# Patient Record
Sex: Male | Born: 1960 | Race: White | Hispanic: No | Marital: Married | Smoking: Never smoker
Health system: Southern US, Community
[De-identification: ages and names within clinical notes are randomized; demographics above are authoritative.]

## PROBLEM LIST (undated history)

## (undated) DIAGNOSIS — L309 Dermatitis, unspecified: Secondary | ICD-10-CM

## (undated) DIAGNOSIS — E119 Type 2 diabetes mellitus without complications: Secondary | ICD-10-CM

## (undated) DIAGNOSIS — Z87898 Personal history of other specified conditions: Secondary | ICD-10-CM

## (undated) DIAGNOSIS — T7840XA Allergy, unspecified, initial encounter: Secondary | ICD-10-CM

## (undated) DIAGNOSIS — K219 Gastro-esophageal reflux disease without esophagitis: Secondary | ICD-10-CM

## (undated) HISTORY — DX: Allergy, unspecified, initial encounter: T78.40XA

## (undated) HISTORY — PX: TOOTH EXTRACTION: SUR596

## (undated) HISTORY — DX: Gastro-esophageal reflux disease without esophagitis: K21.9

---

## 2008-01-01 ENCOUNTER — Emergency Department (HOSPITAL_COMMUNITY): Admission: EM | Admit: 2008-01-01 | Discharge: 2008-01-01 | Payer: Self-pay | Admitting: Emergency Medicine

## 2008-11-10 ENCOUNTER — Emergency Department (HOSPITAL_COMMUNITY): Admission: EM | Admit: 2008-11-10 | Discharge: 2008-11-10 | Payer: Self-pay | Admitting: Emergency Medicine

## 2013-07-13 ENCOUNTER — Encounter: Payer: Self-pay | Admitting: Internal Medicine

## 2013-08-31 ENCOUNTER — Ambulatory Visit (AMBULATORY_SURGERY_CENTER): Payer: BC Managed Care – PPO

## 2013-08-31 VITALS — Ht 72.0 in | Wt 209.0 lb

## 2013-08-31 DIAGNOSIS — Z1211 Encounter for screening for malignant neoplasm of colon: Secondary | ICD-10-CM

## 2013-08-31 MED ORDER — NA SULFATE-K SULFATE-MG SULF 17.5-3.13-1.6 GM/177ML PO SOLN: ORAL | Status: DC

## 2013-08-31 NOTE — Progress Notes (Signed)
Per pt, no allergies to soy or egg products.Pt not taking any weight loss meds or using  O2 at home. 

## 2013-09-10 ENCOUNTER — Encounter: Payer: Self-pay | Admitting: Internal Medicine

## 2013-09-13 ENCOUNTER — Encounter: Payer: Self-pay | Admitting: Internal Medicine

## 2013-09-13 ENCOUNTER — Ambulatory Visit (AMBULATORY_SURGERY_CENTER): Payer: BC Managed Care – PPO | Admitting: Internal Medicine

## 2013-09-13 VITALS — BP 135/87 | HR 76 | Temp 96.8°F | Resp 18 | Ht 72.0 in | Wt 209.0 lb

## 2013-09-13 DIAGNOSIS — K5730 Diverticulosis of large intestine without perforation or abscess without bleeding: Secondary | ICD-10-CM

## 2013-09-13 DIAGNOSIS — Z1211 Encounter for screening for malignant neoplasm of colon: Secondary | ICD-10-CM

## 2013-09-13 DIAGNOSIS — R0683 Snoring: Secondary | ICD-10-CM

## 2013-09-13 MED ORDER — SODIUM CHLORIDE 0.9 % IV SOLN: 500.0000 mL | INTRAVENOUS | Status: DC

## 2013-09-13 NOTE — Progress Notes (Signed)
Report to PACU, RN, vss, BBS= Clear.  

## 2013-09-13 NOTE — Op Note (Signed)
[facility] [address] [place], [id number]   COLONOSCOPY PROCEDURE REPORT  PATIENT: Jack Contreras, Jack Contreras  MR#: [id number] BIRTHDATE: [birth year] , 52  yrs. old GENDER: Male ENDOSCOPIST: Iva Booparl E Gessner, MD, [facility] REFERRED XB:[id number]:Ravisankar Felipa EthAvva, M.D. PROCEDURE DATE:  09/13/2013 PROCEDURE:   Colonoscopy, screening First Screening Colonoscopy - Avg.  risk and is 50 yrs.  old or older Yes.  Prior Negative Screening - Now for repeat screening. N/A  History of Adenoma - Now for follow-up colonoscopy & has been > or = to 3 yrs.  N/A  Polyps Removed Today? No.  Recommend repeat exam, <10 yrs? No. ASA CLASS:   Class I INDICATIONS:average risk screening and first colonoscopy. MEDICATIONS: Propofol (Diprivan) 240 mg IV, MAC sedation, administered by CRNA, and These medications were titrated to patient response per physician's verbal order  DESCRIPTION OF PROCEDURE:   After the risks benefits and alternatives of the procedure were thoroughly explained, informed consent was obtained.  A digital rectal exam revealed no abnormalities of the rectum, A digital rectal exam revealed no prostatic nodules, and A digital rectal exam revealed the prostate was not enlarged.   The LB [id number]  endoscope was introduced through the anus and advanced to the cecum, which was identified by both the appendix and ileocecal valve. No adverse events experienced.   The quality of the prep was excellent using Suprep  The instrument was then slowly withdrawn as the colon was fully examined.   COLON FINDINGS: Mild diverticulosis was noted in the sigmoid colon. The colon mucosa was otherwise normal.   A right colon retroflexion was performed.  Retroflexed views revealed no abnormalities. The time to cecum=2 minutes 16 seconds.  Withdrawal time=8 minutes 01 seconds.  The scope was withdrawn and the procedure completed. COMPLICATIONS: There were no complications.  ENDOSCOPIC IMPRESSION: 1.    Mild diverticulosis was noted in the sigmoid colon 2.   The colon mucosa was otherwise normal  RECOMMENDATIONS: Repeat colonoscopy 10 years -2025 Discuss sleep apnea evaluation with Dr. Felipa EthAvva - heavy snoring with sedation   eSigned:  Iva Booparl E Gessner, MD, [facility] 09/13/2013 2:00 PM   cc: Chilton Greathouseavisankar Avva, MD and The Patient

## 2013-09-13 NOTE — Patient Instructions (Addendum)
No polyps seen. You do have diverticulosis - thickened muscle rings and pouches in the colon wall. Please read the handout about this condition.  Next routine colonoscopy in 10 years - 2025  You snored a lot when we sedated you and this raises a ? Of sleep apnea. You should discuss with Dr. Felipa EthAvva - you may need an evaluation for this.  I appreciate the opportunity to care for you. Iva Booparl E. Gessner, MD, FACG  YOU HAD AN ENDOSCOPIC PROCEDURE TODAY AT THE [facility]: Refer to the procedure report that was given to you for any specific questions about what was found during the examination.  If the procedure report does not answer your questions, please call your gastroenterologist to clarify.  If you requested that your care partner not be given the details of your procedure findings, then the procedure report has been included in a sealed envelope for you to review at your convenience later.  YOU SHOULD EXPECT: Some feelings of bloating in the abdomen. Passage of more gas than usual.  Walking can help get rid of the air that was put into your GI tract during the procedure and reduce the bloating. If you had a lower endoscopy (such as a colonoscopy or flexible sigmoidoscopy) you may notice spotting of blood in your stool or on the toilet paper. If you underwent a bowel prep for your procedure, then you may not have a normal bowel movement for a few days.  DIET: Your first meal following the procedure should be a light meal and then it is ok to progress to your normal diet.  A half-sandwich or bowl of soup is an example of a good first meal.  Heavy or fried foods are harder to digest and may make you feel nauseous or bloated.  Likewise meals heavy in dairy and vegetables can cause extra gas to form and this can also increase the bloating.  Drink plenty of fluids but you should avoid alcoholic beverages for 24 hours.  ACTIVITY: Your care partner should take you home directly after the  procedure.  You should plan to take it easy, moving slowly for the rest of the day.  You can resume normal activity the day after the procedure however you should NOT DRIVE or use heavy machinery for 24 hours (because of the sedation medicines used during the test).    SYMPTOMS TO REPORT IMMEDIATELY: A gastroenterologist can be reached at any hour.  During normal business hours, 8:30 AM to 5:00 PM Monday through Friday, call [phone].  After hours and on weekends, please call the GI answering service at [phone] who will take a message and have the physician on call contact you.   Following lower endoscopy (colonoscopy or flexible sigmoidoscopy):  Excessive amounts of blood in the stool  Significant tenderness or worsening of abdominal pains  Swelling of the abdomen that is new, acute  Fever of 100F or higher    FOLLOW UP: If any biopsies were taken you will be contacted by phone or by letter within the next 1-3 weeks.  Call your gastroenterologist if you have not heard about the biopsies in 3 weeks.  Our staff will call the home number listed on your records the next business day following your procedure to check on you and address any questions or concerns that you may have at that time regarding the information given to you following your procedure. This is a courtesy call and so if there is no answer  at the home number and we have not heard from you through the emergency physician on call, we will assume that you have returned to your regular daily activities without incident.  SIGNATURES/CONFIDENTIALITY: You and/or your care partner have signed paperwork which will be entered into your electronic medical record.  These signatures attest to the fact that that the information above on your After Visit Summary has been reviewed and is understood.  Full responsibility of the confidentiality of this discharge information lies with you and/or your care-partner.   Diverticulosis  information given.  Repeat colonoscopy in 10 years-2025.

## 2013-09-14 ENCOUNTER — Telehealth: Payer: Self-pay | Admitting: *Deleted

## 2013-09-14 NOTE — Telephone Encounter (Signed)
  Follow up Call-  Call back number 09/13/2013  Post procedure Call Back phone  # [phone]  Permission to leave phone message Yes     Patient questions:  Do you have a fever, pain , or abdominal swelling? Yes.  Patient states that nasal allergies have been worse since the procedure. Pain Score  2 *  Have you tolerated food without any problems? Yes.    Have you been able to return to your normal activities? Yes.    Do you have any questions about your discharge instructions: Diet   No. Medications  Yes.   Follow up visit  No.  Do you have questions or concerns about your Care? No.  Actions: * If pain score is 4 or above: No action needed, pain <4.

## 2016-08-02 ENCOUNTER — Other Ambulatory Visit: Payer: Self-pay | Admitting: Internal Medicine

## 2016-08-02 DIAGNOSIS — G519 Disorder of facial nerve, unspecified: Secondary | ICD-10-CM

## 2016-08-14 ENCOUNTER — Ambulatory Visit
Admission: RE | Admit: 2016-08-14 | Discharge: 2016-08-14 | Disposition: A | Discharge status: Home / Self Care | Payer: 59 | Source: Ambulatory Visit | Attending: Internal Medicine | Admitting: Internal Medicine

## 2016-08-14 DIAGNOSIS — G519 Disorder of facial nerve, unspecified: Secondary | ICD-10-CM

## 2016-08-14 MED ORDER — GADOBENATE DIMEGLUMINE 529 MG/ML IV SOLN
20.0000 mL | Freq: Once | INTRAVENOUS | Status: AC | PRN
  Administered 2016-08-14: 20 mL via INTRAVENOUS

## 2020-06-23 ENCOUNTER — Telehealth: Payer: Self-pay | Admitting: Radiology

## 2020-06-23 NOTE — Telephone Encounter (Signed)
Incorrect chart

## 2020-06-23 NOTE — Telephone Encounter (Deleted)
Patient called finger draining Proceed as scheduled per Dr Harrison I will call him later today make sure he is bandaging it  

## 2020-11-15 ENCOUNTER — Ambulatory Visit (HOSPITAL_COMMUNITY)
Admission: EM | Admit: 2020-11-15 | Discharge: 2020-11-15 | Disposition: A | Discharge status: Home / Self Care | Payer: 59

## 2020-11-15 ENCOUNTER — Encounter (HOSPITAL_COMMUNITY): Payer: Self-pay | Admitting: Emergency Medicine

## 2020-11-15 ENCOUNTER — Other Ambulatory Visit: Payer: Self-pay

## 2020-11-15 ENCOUNTER — Ambulatory Visit (INDEPENDENT_AMBULATORY_CARE_PROVIDER_SITE_OTHER): Payer: 59

## 2020-11-15 DIAGNOSIS — Z23 Encounter for immunization: Secondary | ICD-10-CM | POA: Diagnosis not present

## 2020-11-15 DIAGNOSIS — L03114 Cellulitis of left upper limb: Secondary | ICD-10-CM | POA: Diagnosis not present

## 2020-11-15 DIAGNOSIS — M79642 Pain in left hand: Secondary | ICD-10-CM

## 2020-11-15 HISTORY — DX: Dermatitis, unspecified: L30.9

## 2020-11-15 HISTORY — DX: Personal history of other specified conditions: Z87.898

## 2020-11-15 MED ORDER — TETANUS-DIPHTH-ACELL PERTUSSIS 5-2.5-18.5 LF-MCG/0.5 IM SUSY
PREFILLED_SYRINGE | INTRAMUSCULAR | Status: AC
  Filled 2020-11-15: qty 0.5

## 2020-11-15 MED ORDER — DOXYCYCLINE HYCLATE 100 MG PO CAPS: 100.0000 mg | ORAL_CAPSULE | Freq: Two times a day (BID) | ORAL | 0 refills | Status: AC

## 2020-11-15 MED ORDER — TETANUS-DIPHTH-ACELL PERTUSSIS 5-2.5-18.5 LF-MCG/0.5 IM SUSY
0.5000 mL | PREFILLED_SYRINGE | Freq: Once | INTRAMUSCULAR | Status: AC
  Administered 2020-11-15: 0.5 mL via INTRAMUSCULAR

## 2020-11-15 NOTE — ED Provider Notes (Signed)
[facility]    CSN: [id number] Arrival date & time: 11/15/20  1248      History   Chief Complaint Chief Complaint  Patient presents with   Hand Problem    HPI Jack Contreras is a 60 y.o. male presenting with L hand swelling x8 days following insect bite. Medical history eczema, allergies, prediabetes.  States he initially thought the lesion was an eczema flare, but the swelling and redness has persisted.  Initially with a spot on the palmar aspect of the base of his third finger, now with swelling and redness extending to the dorsum of the hand.  Pain with making a fist.  Some subjective chills and temperature running 99. Tylenol, ibuprofen, neosporin providing minimal relief.  HPI  Past Medical History:  Diagnosis Date   Allergy    Eczema    GERD (gastroesophageal reflux disease)    History of prediabetes     There are no problems to display for this patient.   Past Surgical History:  Procedure Laterality Date   TOOTH EXTRACTION         Home Medications    Prior to Admission medications   Medication Sig Start Date End Date Taking? Authorizing Provider  cetirizine (ZYRTEC) 10 MG tablet Take 10 mg by mouth daily.   Yes [provider]  doxycycline (VIBRAMYCIN) 100 MG capsule Take 1 capsule (100 mg total) by mouth 2 (two) times daily for 7 days. 11/15/20 11/22/20 Yes Rhys Martini, PA-C  famotidine (PEPCID) 20 MG tablet Take 1 tablet by mouth daily. 05/03/18  Yes [provider]  ibuprofen (ADVIL,MOTRIN) 200 MG tablet Take 200 mg by mouth every 6 (six) hours as needed.    [provider]    Family History Family History  Problem Relation Age of Onset   Hypertension Mother    Heart disease Father    Diabetes Father    Hypertension Sister    Hypertension Brother     Social History Social History   Tobacco Use   Smoking status: Never   Smokeless tobacco: Never  Substance Use Topics   Alcohol use: No   Drug use: No      Allergies   Patient has no known allergies.   Review of Systems Review of Systems  Skin:  Positive for color change.    Physical Exam Triage Vital Signs ED Triage Vitals  Enc Vitals Group     BP 11/15/20 1412 (!) 146/97     Pulse Rate 11/15/20 1412 88     Resp 11/15/20 1412 17     Temp 11/15/20 1412 99.2 F (37.3 C)     Temp Source 11/15/20 1412 Oral     SpO2 11/15/20 1412 100 %     Weight --      Height --      Head Circumference --      Peak Flow --      Pain Score 11/15/20 1409 5     Pain Loc --      Pain Edu? --      Excl. in GC? --    No data found.  Updated Vital Signs BP (!) 146/97 (BP Location: Right Arm)   Pulse 88   Temp 99.2 F (37.3 C) (Oral)   Resp 17   SpO2 100%   Visual Acuity Right Eye Distance:   Left Eye Distance:   Bilateral Distance:    Right Eye Near:   Left Eye Near:    Bilateral  Near:     Physical Exam Vitals reviewed.  Constitutional:      General: He is not in acute distress.    Appearance: Normal appearance. He is not ill-appearing or diaphoretic.  HENT:     Head: Normocephalic and atraumatic.  Cardiovascular:     Rate and Rhythm: Normal rate and regular rhythm.     Heart sounds: Normal heart sounds.  Pulmonary:     Effort: Pulmonary effort is normal.     Breath sounds: Normal breath sounds.  Skin:    General: Skin is warm.     Comments: See image below L hand- 3rd MCP joint with erythema tenderness and effusion extending to the PIP joint. ROM MCP intact but with some pain. ROM PIP and DIP intact and without pain. Grip strength 5/5. Cap refill <2 seconds, radial pulse 2+.   Neurological:     General: No focal deficit present.     Mental Status: He is alert and oriented to person, place, and time.  Psychiatric:        Mood and Affect: Mood normal.        Behavior: Behavior normal.        Thought Content: Thought content normal.        Judgment: Judgment normal.         UC Treatments / Results  Labs (all  labs ordered are listed, but only abnormal results are displayed) Labs Reviewed - No data to display  EKG   Radiology DG Hand Complete Left  Result Date: 11/15/2020 CLINICAL DATA:  pain and swelling 3rd MCP joint x8 days. ROM intact. EXAM: LEFT HAND - COMPLETE 3+ VIEW COMPARISON:  None. FINDINGS: No acute fracture or dislocation. Joint spaces and alignment are maintained. No area of erosion or osseous destruction. No unexpected radiopaque foreign body. Soft tissues are unremarkable. IMPRESSION: No acute fracture or dislocation. Electronically Signed   By: Meda Klinefelter M.D.   On: 11/15/2020 15:04    Procedures Procedures (including critical care time)  Medications Ordered in UC Medications  Tdap (BOOSTRIX) injection 0.5 mL (has no administration in time range)    Initial Impression / Assessment and Plan / UC Course  I have reviewed the triage vital signs and the nursing notes.  Pertinent labs & imaging results that were available during my care of the patient were reviewed by me and considered in my medical decision making (see chart for details).     This patient is a very pleasant 60 y.o. year old male presenting with cellulitis L hand. Neurovascularly intact. Tdap not UTD- administered today.  Given tenderness and swelling localized to 3rd MCP joint, did check an xray. Xray L hand: . No area of erosion or osseous destruction.   Doxycycline. Wound care discussed .  Strict ED return precautions discussed. Patient verbalizes understanding and agreement.    Final Clinical Impressions(s) / UC Diagnoses   Final diagnoses:  Cellulitis of hand, left  Need for Tdap vaccination     Discharge Instructions      -Doxycycline twice daily for 7 days.  Make sure to wear sunscreen while spending time outside while on this medication as it can increase your chance of sunburn. You can take this medication with food if you have a sensitive stomach. -Wash your wound with gentle soap  and water 1-2 times daily.  Let air dry or gently pat. Avoid cleansing with hydrogen peroxide or alcohol!! -Seek additional medical attention if the wound is getting worse instead of  better- redness increasing in size, pain getting worse, new/worsening discharge, new fevers/chills, etc.      ED Prescriptions     Medication Sig Dispense Auth. Provider   doxycycline (VIBRAMYCIN) 100 MG capsule Take 1 capsule (100 mg total) by mouth 2 (two) times daily for 7 days. 14 capsule Rhys Martini, PA-C      PDMP not reviewed this encounter.   Rhys Martini, PA-C 11/15/20 [phone]

## 2020-11-15 NOTE — ED Triage Notes (Signed)
Patient c/o LFT hand swelling x 8 days.   Patient states onset of symptoms began with " a spot on my palm that I thought may had been a spider bite or a flare up of my eczema, this has now caused the other side of my hand to become swollen, I also had a low grade fever".   Patient denies drainage.   Patient endorses erythema and soreness.   Patient has taken Tylenol, Advil, and Neosporin w/ no relief of symptoms.   Patient stats " I may be diabetic I'm not sure". History of prediabetes.

## 2020-11-15 NOTE — Discharge Instructions (Addendum)
-  Doxycycline twice daily for 7 days.  Make sure to wear sunscreen while spending time outside while on this medication as it can increase your chance of sunburn. You can take this medication with food if you have a sensitive stomach. -Wash your wound with gentle soap and water 1-2 times daily.  Let air dry or gently pat. Avoid cleansing with hydrogen peroxide or alcohol!! -Seek additional medical attention if the wound is getting worse instead of better- redness increasing in size, pain getting worse, new/worsening discharge, new fevers/chills, etc.

## 2021-01-31 ENCOUNTER — Ambulatory Visit
Admission: EM | Admit: 2021-01-31 | Discharge: 2021-01-31 | Disposition: A | Discharge status: Home / Self Care | Payer: Managed Care, Other (non HMO) | Attending: Urgent Care | Admitting: Urgent Care

## 2021-01-31 ENCOUNTER — Other Ambulatory Visit: Payer: Self-pay

## 2021-01-31 ENCOUNTER — Encounter: Payer: Self-pay | Admitting: Emergency Medicine

## 2021-01-31 ENCOUNTER — Ambulatory Visit (INDEPENDENT_AMBULATORY_CARE_PROVIDER_SITE_OTHER): Payer: Managed Care, Other (non HMO)

## 2021-01-31 DIAGNOSIS — M25552 Pain in left hip: Secondary | ICD-10-CM

## 2021-01-31 DIAGNOSIS — M160 Bilateral primary osteoarthritis of hip: Secondary | ICD-10-CM

## 2021-01-31 MED ORDER — NAPROXEN 375 MG PO TABS: 375.0000 mg | ORAL_TABLET | Freq: Two times a day (BID) | ORAL | 0 refills | Status: DC

## 2021-01-31 MED ORDER — TIZANIDINE HCL 4 MG PO TABS: 4.0000 mg | ORAL_TABLET | Freq: Every day | ORAL | 0 refills | Status: DC

## 2021-01-31 NOTE — ED Provider Notes (Signed)
[facility]   MRN: [id number] DOB: Dec 20, 1960  Subjective:   Naim Murtha is a 60 y.o. male presenting for 1 day history of acute onset left posterior hip pain, left hamstring pain and to a lesser degree left knee pain. No fall, trauma, weakness, numbness or tingling, back pain, hematuria, rashes. 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   No current facility-administered medications for this encounter.  Current Outpatient Medications:    cetirizine (ZYRTEC) 10 MG tablet, Take 10 mg by mouth daily., Disp: , Rfl:    famotidine (PEPCID) 20 MG tablet, Take 1 tablet by mouth daily., Disp: , Rfl:    ibuprofen (ADVIL,MOTRIN) 200 MG tablet, Take 200 mg by mouth every 6 (six) hours as needed., Disp: , Rfl:    No Known Allergies  Past Medical History:  Diagnosis Date   Allergy    Eczema    GERD (gastroesophageal reflux disease)    History of prediabetes      Past Surgical History:  Procedure Laterality Date   TOOTH EXTRACTION      Family History  Problem Relation Age of Onset   Hypertension Mother    Heart disease Father    Diabetes Father    Hypertension Sister    Hypertension Brother     Social History   Tobacco Use   Smoking status: Never   Smokeless tobacco: Never  Substance Use Topics   Alcohol use: No   Drug use: No    ROS   Objective:   Vitals: BP (!) 127/92 (BP Location: Right Arm)   Pulse 97   Temp 98.3 F (36.8 C) (Oral)   Resp 18   SpO2 96%   Physical Exam Constitutional:      General: He is not in acute distress.    Appearance: Normal appearance. He is well-developed and normal weight. He is not ill-appearing, toxic-appearing or diaphoretic.  HENT:     Head: Normocephalic and atraumatic.     Right Ear: External ear normal.     Left Ear: External ear normal.     Nose: Nose normal.     Mouth/Throat:     Pharynx: Oropharynx is clear.  Eyes:     General: No scleral icterus.       Right eye: No discharge.         Left eye: No discharge.     Extraocular Movements: Extraocular movements intact.     Pupils: Pupils are equal, round, and reactive to light.  Cardiovascular:     Rate and Rhythm: Normal rate.  Pulmonary:     Effort: Pulmonary effort is normal.  Musculoskeletal:     Cervical back: Normal range of motion.     Left hip: Tenderness (over area outlined) present. No deformity, lacerations, bony tenderness or crepitus. Decreased range of motion.       Legs:     Comments: No back pain, midline tenderness.  Neurological:     Mental Status: He is alert and oriented to person, place, and time.  Psychiatric:        Mood and Affect: Mood normal.        Behavior: Behavior normal.        Thought Content: Thought content normal.        Judgment: Judgment normal.    DG Hip Unilat With Pelvis 2-3 Views Left  Result Date: 01/31/2021 CLINICAL DATA:  Left hip and knee pain starting yesterday morning. EXAM: DG HIP (WITH OR WITHOUT PELVIS)  2-3V LEFT COMPARISON:  None. FINDINGS: There is no evidence of hip fracture or dislocation. Mild narrow bilateral hip joint spaces are noted. Extensive bowel content is identified throughout colon. IMPRESSION: 1. No acute fracture or dislocation. 2. 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 Electronically Signed   By: Abelardo Diesel M.D.   On: 01/31/2021 11:03    Assessment and Plan :   PDMP not reviewed this encounter.  1. Acute hip pain, left   2. Left hip pain    Recommended conservative management with naproxen, tizanidine for musculoskeletal pain, trochanteric bursitis, mild arthritic pain.  Follow-up with Ortho, Dr. Aline Brochure, and his PCP. Counseled patient on potential for adverse effects with medications prescribed/recommended today, ER and return-to-clinic precautions discussed, patient verbalized understanding.    Jaynee Eagles, PA-C 01/31/21 1126

## 2021-01-31 NOTE — ED Triage Notes (Signed)
Pt c/o of left hip/leg/knee pain that began yesterday morning. Denies injury.

## 2021-08-16 ENCOUNTER — Encounter (HOSPITAL_COMMUNITY): Payer: Self-pay

## 2021-08-16 ENCOUNTER — Ambulatory Visit (HOSPITAL_COMMUNITY)
Admission: RE | Admit: 2021-08-16 | Discharge: 2021-08-16 | Disposition: A | Discharge status: Home / Self Care | Payer: Managed Care, Other (non HMO) | Source: Ambulatory Visit | Attending: Internal Medicine | Admitting: Internal Medicine

## 2021-08-16 VITALS — BP 102/78 | HR 71 | Temp 98.1°F | Resp 18

## 2021-08-16 DIAGNOSIS — S50812A Abrasion of left forearm, initial encounter: Secondary | ICD-10-CM

## 2021-08-16 DIAGNOSIS — M79632 Pain in left forearm: Secondary | ICD-10-CM

## 2021-08-16 MED ORDER — BACITRACIN ZINC 500 UNIT/GM EX OINT
TOPICAL_OINTMENT | CUTANEOUS | Status: AC
  Filled 2021-08-16: qty 0.9

## 2021-08-16 MED ORDER — BACITRACIN ZINC 500 UNIT/GM EX OINT
TOPICAL_OINTMENT | CUTANEOUS | Status: AC
  Filled 2021-08-16: qty 28.35

## 2021-08-16 NOTE — ED Triage Notes (Signed)
Patient presents to Urgent Care with complaints of laceration on l arm since Thursday. Patient reports he was hedge trimming and he slipped and it hit his arm. Pt [facility] came to see jim but they didn't do anything. Pt cleaned with antibacterial soap and water and bandaged up.

## 2021-08-16 NOTE — ED Notes (Signed)
Applied bacitracin to wound bed non adherent gauze and coban. Per md

## 2021-08-16 NOTE — ED Provider Notes (Signed)
[facility]    CSN: [id number] Arrival date & time: 08/16/21  1408      History   Chief Complaint Chief Complaint  Patient presents with   Arm Injury    Cut left forearm with battery operated hedge trimmers on Thursday while out of town. - Entered by patient   Laceration    HPI Jack Contreras is a 61 y.o. male.   61 year old male presents with left forearm abrasion.  Patient relates that he was cutting his mother's hedges on Thursday with a hedge tremor, when it slipped and struck his left forearm.  This caused some cut-braised areas.  Patient relates he had mild bleeding and pain at the time.  Patient relates he was seen by EMS who dressed the wound area.  Patient relates his last tetanus immunization was a year ago.  Patient has been washing the area with soap and water twice daily, has been wearing a bandage, and did use Steri-Strips to bring the abraded skin back together.  There is no fever or chills.  Patient relates he has no pain, and there is is no swelling at the braised-cut area.  The area involved is about 3 inches in length.   Arm Injury Laceration  Past Medical History:  Diagnosis Date   Allergy    Eczema    GERD (gastroesophageal reflux disease)    History of prediabetes     There are no problems to display for this patient.   Past Surgical History:  Procedure Laterality Date   TOOTH EXTRACTION         Home Medications    Prior to Admission medications   Medication Sig Start Date End Date Taking? Authorizing Provider  cetirizine (ZYRTEC) 10 MG tablet Take 10 mg by mouth daily.    [provider]  famotidine (PEPCID) 20 MG tablet Take 1 tablet by mouth daily. 05/03/18   [provider]  ibuprofen (ADVIL,MOTRIN) 200 MG tablet Take 200 mg by mouth every 6 (six) hours as needed.    [provider]  naproxen (NAPROSYN) 375 MG tablet Take 1 tablet (375 mg total) by mouth 2 (two) times daily with a meal. 01/31/21    Wallis Bamberg, PA-C  tiZANidine (ZANAFLEX) 4 MG tablet Take 1 tablet (4 mg total) by mouth at bedtime. 01/31/21   Wallis Bamberg, PA-C    Family History Family History  Problem Relation Age of Onset   Hypertension Mother    Heart disease Father    Diabetes Father    Hypertension Sister    Hypertension Brother     Social History Social History   Tobacco Use   Smoking status: Never   Smokeless tobacco: Never  Substance Use Topics   Alcohol use: No   Drug use: No     Allergies   Patient has no known allergies.   Review of Systems Review of Systems  Skin:  Positive for wound (Left Forearm abraison).    Physical Exam Triage Vital Signs ED Triage Vitals  Enc Vitals Group     BP 08/16/21 1447 102/78     Pulse Rate 08/16/21 1447 71     Resp 08/16/21 1447 18     Temp 08/16/21 1447 98.1 F (36.7 C)     Temp Source 08/16/21 1447 Oral     SpO2 08/16/21 1447 98 %     Weight --      Height --      Head Circumference --  Peak Flow --      Pain Score 08/16/21 1446 0     Pain Loc --      Pain Edu? --      Excl. in [place]? --    No data found.  Updated Vital Signs BP 102/78   Pulse 71   Temp 98.1 F (36.7 C) (Oral)   Resp 18   SpO2 98%   Visual Acuity Right Eye Distance:   Left Eye Distance:   Bilateral Distance:    Right Eye Near:   Left Eye Near:    Bilateral Near:     Physical Exam Constitutional:      Appearance: Normal appearance.  Skin:         Comments: Left forearm: Mid posterior forearm there is a 3 inch jagged a braised-avulsed area, the skin flaps have been placed over the wound, there is really good healing, there is no redness or drainage.  Full range of motion of the extremity is normal.  Neurological:     Mental Status: He is alert.     UC Treatments / Results  Labs (all labs ordered are listed, but only abnormal results are displayed) Labs Reviewed - No data to display  EKG   Radiology No results found.  Procedures Procedures  (including critical care time)  Medications Ordered in UC Medications - No data to display  Initial Impression / Assessment and Plan / UC Course  I have reviewed the triage vital signs and the nursing notes.  Pertinent labs & imaging results that were available during my care of the patient were reviewed by me and considered in my medical decision making (see chart for details).    Plan: 1.  Advised to wash twice a day with soap and water. 2.  Advised to keep the area clean, apply Polytrim ointment lightly twice a day. 3.  Advised to observe for any signs of infection, redness, swelling, drainage, and follow-up with PCP if symptoms fail to improve or return to urgent care. Final Clinical Impressions(s) / UC Diagnoses   Final diagnoses:  Abrasion of left forearm, initial encounter  Left forearm pain     Discharge Instructions      Advised to continue washing with soap and water 2-3 times a day. Has to apply Polysporin ointment lightly twice a day. Advised to use a nonstick dressing keep the area covered however when washing with soap and water pat dry and let air dry for about 20 to 30 minutes before applying any ointment or dressing.  2-3 times a day Advised to take Tylenol or ibuprofen needed for pain Advised to watch for any redness, swelling, drainage, if this occurs then follow-up with PCP or return to urgent care.    ED Prescriptions   None    PDMP not reviewed this encounter.   Ellsworth Lennox, PA-C 08/16/21 [phone]

## 2021-08-16 NOTE — ED Notes (Signed)
Bandage removed  Wound cleansed with hibacleanse and washed with warm water.

## 2021-08-16 NOTE — Discharge Instructions (Addendum)
Advised to continue washing with soap and water 2-3 times a day. Has to apply Polysporin ointment lightly twice a day. Advised to use a nonstick dressing keep the area covered however when washing with soap and water pat dry and let air dry for about 20 to 30 minutes before applying any ointment or dressing.  2-3 times a day Advised to take Tylenol or ibuprofen needed for pain Advised to watch for any redness, swelling, drainage, if this occurs then follow-up with PCP or return to urgent care.

## 2022-05-20 ENCOUNTER — Encounter: Payer: Self-pay | Admitting: Radiology

## 2022-06-07 ENCOUNTER — Ambulatory Visit
Admission: RE | Admit: 2022-06-07 | Discharge: 2022-06-07 | Disposition: A | Discharge status: Home / Self Care | Payer: Managed Care, Other (non HMO) | Source: Ambulatory Visit

## 2022-06-07 VITALS — BP 145/96 | HR 68 | Temp 98.0°F | Resp 16

## 2022-06-07 DIAGNOSIS — J22 Unspecified acute lower respiratory infection: Secondary | ICD-10-CM

## 2022-06-07 DIAGNOSIS — J069 Acute upper respiratory infection, unspecified: Secondary | ICD-10-CM

## 2022-06-07 NOTE — ED Triage Notes (Signed)
Pt c/o sore throat, cough, congestion,   Onset ~ yesterday

## 2022-06-07 NOTE — ED Provider Notes (Signed)
[facility]    CSN: [id number] Arrival date & time: 06/07/22  0858      History   Chief Complaint Chief Complaint  Patient presents with   Cough    HPI Jack Contreras is a 62 y.o. male.   62 y/o male presents with cough and congestion.  Patient indicates for the past 1 to 2 days he has had upper respiratory congestion with rhinitis and postnasal drip which is thick but mainly clear.  Patient does indicate for the past 2 days he had sore throat and painful swallowing which resolved yesterday.  He indicates he is now having some chest congestion with intermittent cough, production is clear.  He indicates he is not having wheezing, shortness of breath, fever or chills.  He is tolerating fluids well.  He has been taking some DayQuil and NyQuil with mild relief of the symptoms.  He does indicate having a coworker that went home sick yesterday but he is not aware of what type of illness.  Patient is without nausea or vomiting.   Cough Associated symptoms: rhinorrhea     Past Medical History:  Diagnosis Date   Allergy    Eczema    GERD (gastroesophageal reflux disease)    History of prediabetes     There are no problems to display for this patient.   Past Surgical History:  Procedure Laterality Date   TOOTH EXTRACTION         Home Medications    Prior to Admission medications   Medication Sig Start Date End Date Taking? Authorizing Provider  cetirizine (ZYRTEC) 10 MG tablet Take 10 mg by mouth daily.    [provider]  clobetasol cream (TEMOVATE) 0.05 % SMARTSIG:Sparingly Topical Twice Daily    [provider]  famotidine (PEPCID) 20 MG tablet Take 1 tablet by mouth daily. 05/03/18   [provider]  ibuprofen (ADVIL,MOTRIN) 200 MG tablet Take 200 mg by mouth every 6 (six) hours as needed.    [provider]  naproxen (NAPROSYN) 375 MG tablet Take 1 tablet (375 mg total) by mouth 2 (two) times daily with a meal. 01/31/21    Jaynee Eagles, PA-C  tiZANidine (ZANAFLEX) 4 MG tablet Take 1 tablet (4 mg total) by mouth at bedtime. 01/31/21   Jaynee Eagles, PA-C    Family History Family History  Problem Relation Age of Onset   Hypertension Mother    Heart disease Father    Diabetes Father    Hypertension Sister    Hypertension Brother     Social History Social History   Tobacco Use   Smoking status: Never   Smokeless tobacco: Never  Substance Use Topics   Alcohol use: No   Drug use: No     Allergies   Patient has no known allergies.   Review of Systems Review of Systems  HENT:  Positive for postnasal drip, rhinorrhea and sinus pain.   Respiratory:  Positive for cough.      Physical Exam Triage Vital Signs ED Triage Vitals  Enc Vitals Group     BP 06/07/22 0914 (!) 145/96     Pulse Rate 06/07/22 0912 68     Resp 06/07/22 0912 16     Temp 06/07/22 0912 98 F (36.7 C)     Temp Source 06/07/22 0912 Oral     SpO2 06/07/22 0912 98 %     Weight --      Height --      Head Circumference --  Peak Flow --      Pain Score 06/07/22 0912 0     Pain Loc --      Pain Edu? --      Excl. in [address]? --    No data found.  Updated Vital Signs BP (!) 145/96 (BP Location: Left Arm)   Pulse 68   Temp 98 F (36.7 C) (Oral)   Resp 16   SpO2 98%   Visual Acuity Right Eye Distance:   Left Eye Distance:   Bilateral Distance:    Right Eye Near:   Left Eye Near:    Bilateral Near:     Physical Exam Constitutional:      Appearance: Normal appearance.  HENT:     Right Ear: Tympanic membrane and ear canal normal.     Left Ear: Tympanic membrane and ear canal normal.     Mouth/Throat:     Mouth: Mucous membranes are moist.     Pharynx: Oropharynx is clear. No posterior oropharyngeal erythema.  Cardiovascular:     Rate and Rhythm: Normal rate and regular rhythm.     Heart sounds: Normal heart sounds.  Pulmonary:     Effort: Pulmonary effort is normal.     Breath sounds: Normal breath sounds  and air entry. No wheezing, rhonchi or rales.  Lymphadenopathy:     Cervical: No cervical adenopathy.  Neurological:     Mental Status: He is alert.      UC Treatments / Results  Labs (all labs ordered are listed, but only abnormal results are displayed) Labs Reviewed - No data to display  EKG   Radiology No results found.  Procedures Procedures (including critical care time)  Medications Ordered in UC Medications - No data to display  Initial Impression / Assessment and Plan / UC Course  I have reviewed the triage vital signs and the nursing notes.  Pertinent labs & imaging results that were available during my care of the patient were reviewed by me and considered in my medical decision making (see chart for details).    Plan: The diagnosis will be treated with the following: 1.  Upper respiratory tract infection: A.  Advised to continue taking DayQuil to help control cough and congestion. 2.  Lower respiratory tract infection: B.  Advised to continue taking DayQuil/NyQuil to help control cough and congestion. 3.  Patient advised follow-up PCP or return to urgent care as needed. Final Clinical Impressions(s) / UC Diagnoses   Final diagnoses:  Acute upper respiratory infection  Lower respiratory infection     Discharge Instructions      Advised to continue to use DayQuil and NyQuil to help control the cough, congestion, and drainage.  Advised to increase fluid intake, observe and if symptoms fail to improve over the next several days or if fever develops or symptoms worsen and is followed wheezing or any shortness of breath then advised to return for reevaluation.    ED Prescriptions   None    PDMP not reviewed this encounter.   Nyoka Lint, PA-C 06/07/22 [phone]

## 2022-06-07 NOTE — Discharge Instructions (Signed)
Advised to continue to use DayQuil and NyQuil to help control the cough, congestion, and drainage.  Advised to increase fluid intake, observe and if symptoms fail to improve over the next several days or if fever develops or symptoms worsen and is followed wheezing or any shortness of breath then advised to return for reevaluation.

## 2022-08-13 IMAGING — DX DG HIP (WITH OR WITHOUT PELVIS) 2-3V*L*
3 series · 3 of 3 positions shown · non-contrast
Comparison: None.

CLINICAL DATA: Left hip and knee pain starting yesterday morning.

EXAM:
DG HIP (WITH OR WITHOUT PELVIS) 2-3V LEFT

[pelvis ap]
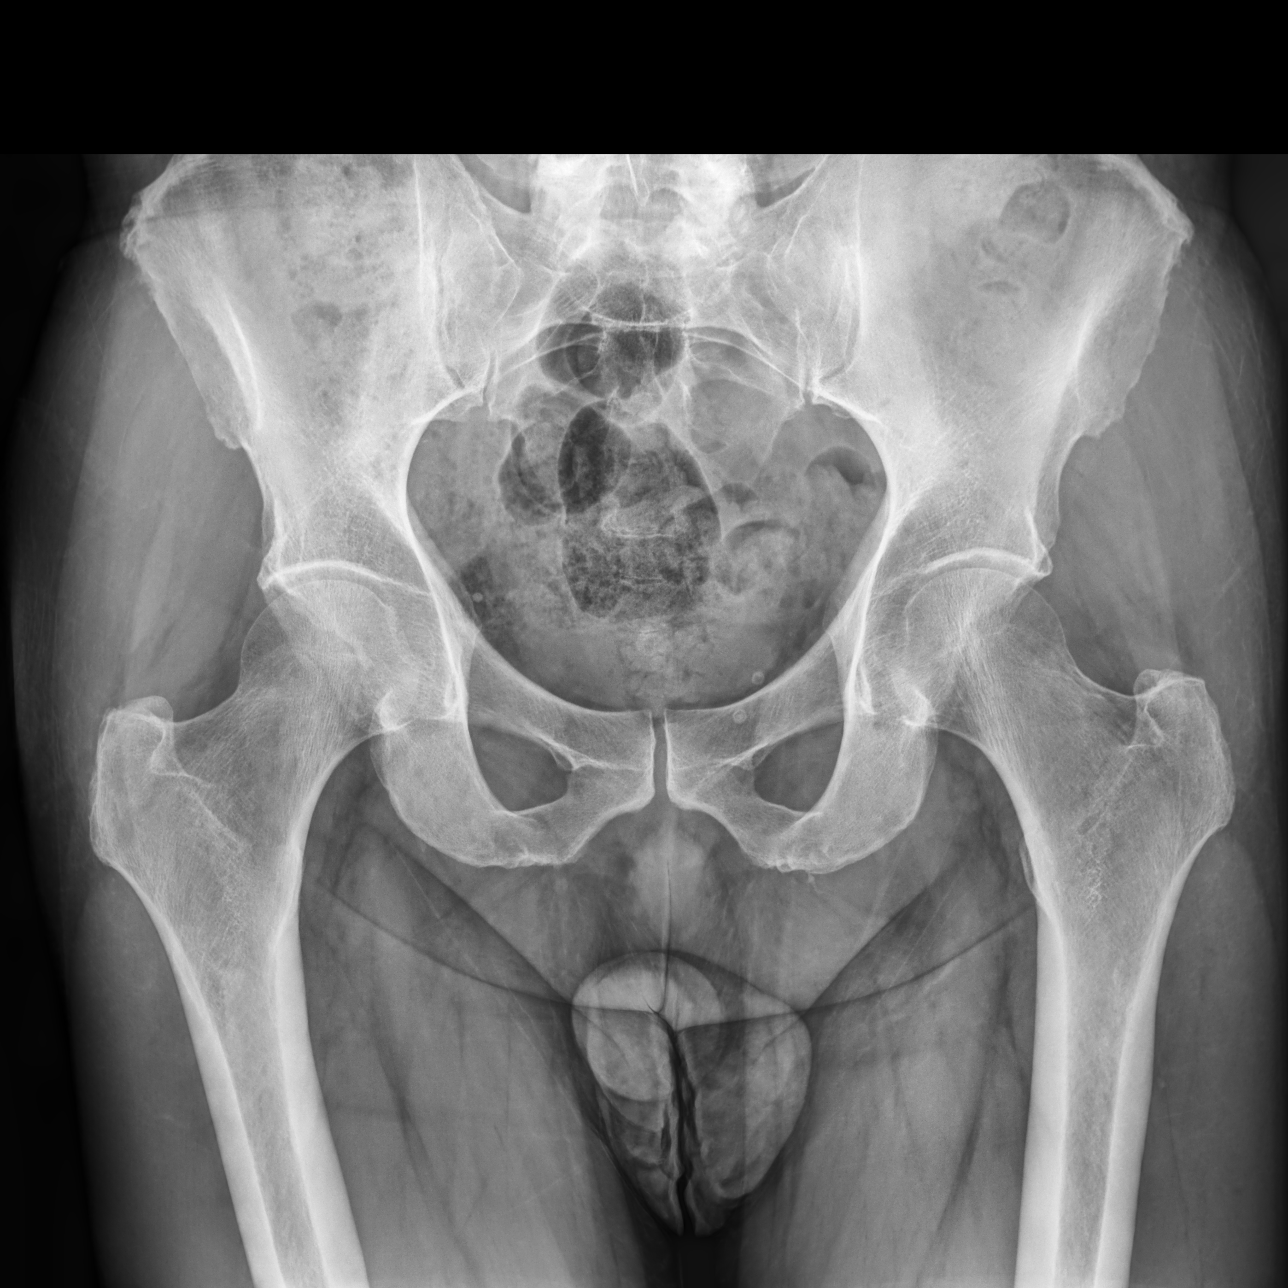

[hip ap]
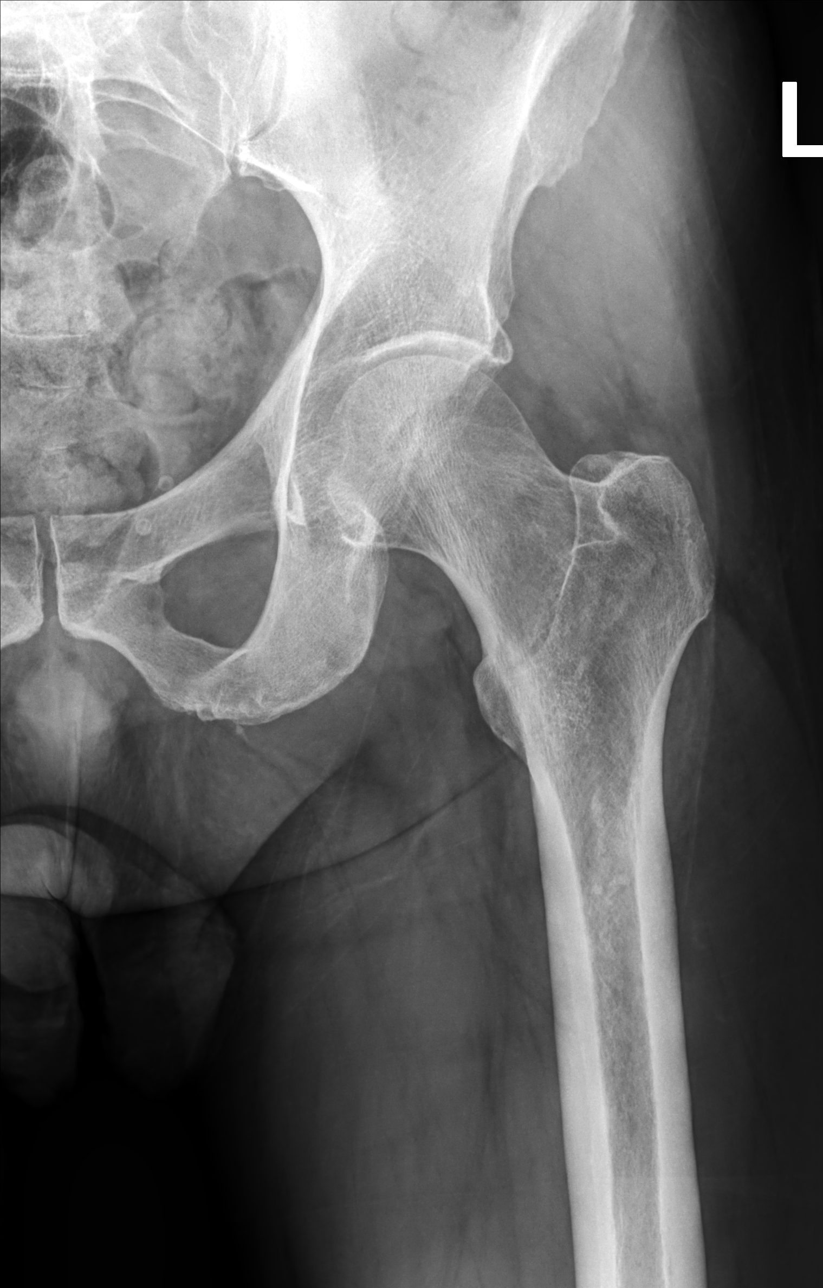

[hip (frog leg)]
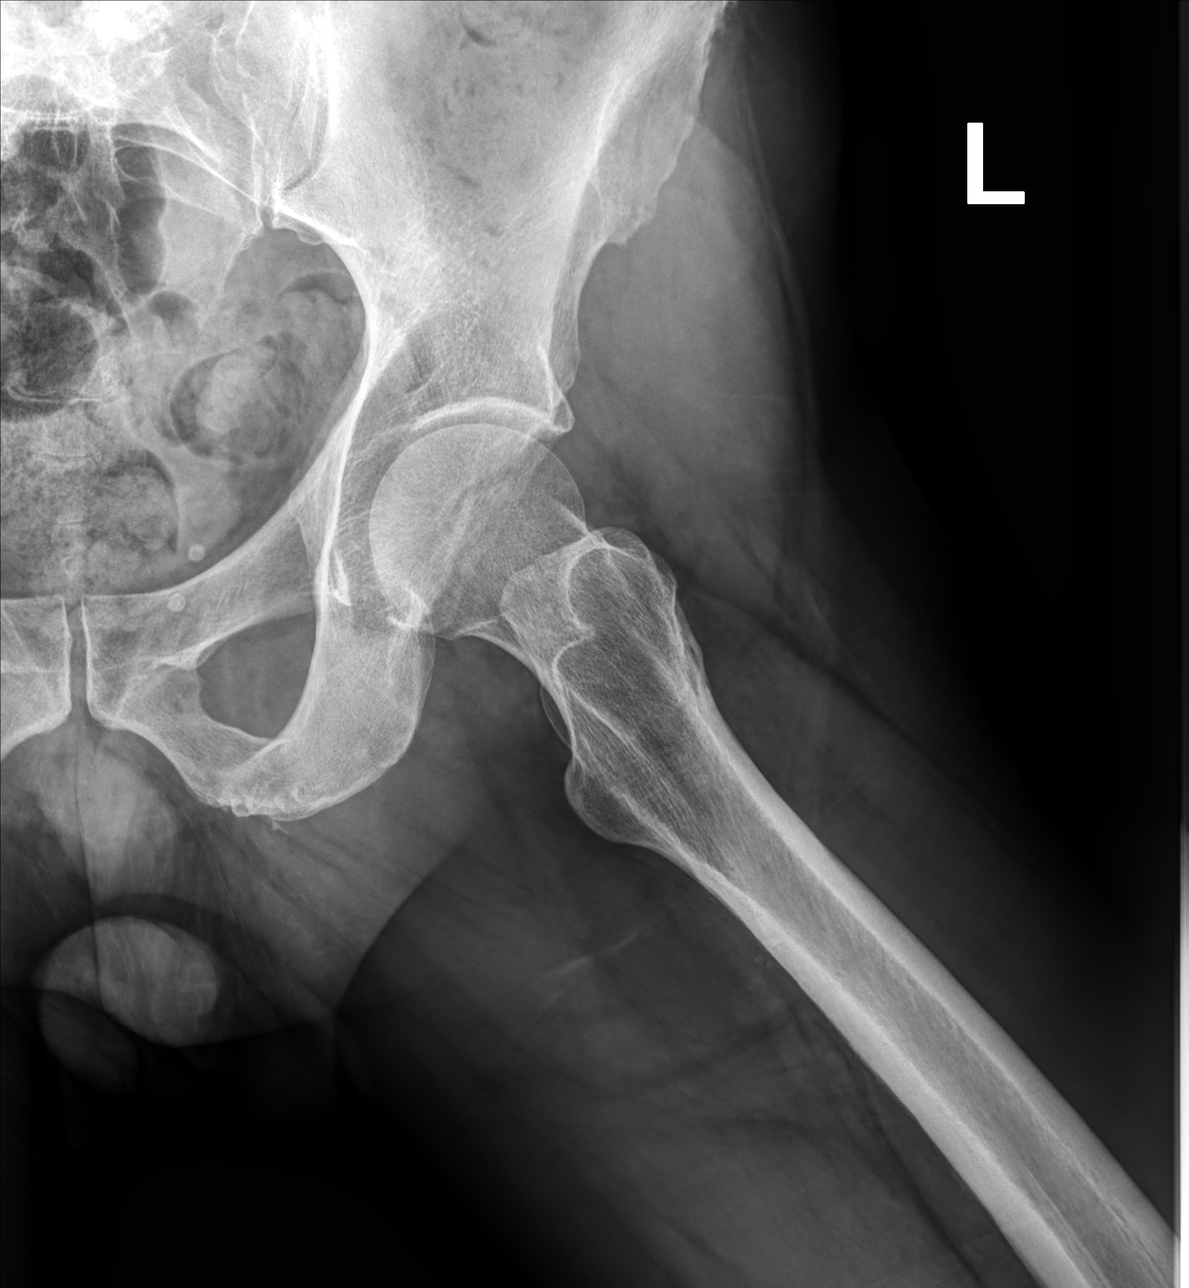

[3 of 3 positions shown; findings below may reference images not displayed]

FINDINGS: There is no evidence of hip fracture or dislocation. Mild narrow
bilateral hip joint spaces are noted. Extensive bowel content is
identified throughout colon.
IMPRESSION: 1. No acute fracture or dislocation.
2. Mild degenerative joint changes of bilateral hips.

## 2022-09-09 ENCOUNTER — Ambulatory Visit (HOSPITAL_COMMUNITY): Payer: Managed Care, Other (non HMO)

## 2022-11-11 ENCOUNTER — Encounter: Payer: Self-pay | Admitting: Internal Medicine

## 2022-11-11 ENCOUNTER — Other Ambulatory Visit: Payer: Self-pay | Admitting: Internal Medicine

## 2022-11-11 DIAGNOSIS — Z1231 Encounter for screening mammogram for malignant neoplasm of breast: Secondary | ICD-10-CM

## 2022-11-30 ENCOUNTER — Other Ambulatory Visit: Payer: Self-pay | Admitting: Internal Medicine

## 2022-11-30 DIAGNOSIS — N6321 Unspecified lump in the left breast, upper outer quadrant: Secondary | ICD-10-CM

## 2022-12-23 ENCOUNTER — Ambulatory Visit
Admission: RE | Admit: 2022-12-23 | Discharge: 2022-12-23 | Disposition: A | Discharge status: Home / Self Care | Payer: Managed Care, Other (non HMO) | Source: Ambulatory Visit | Attending: Internal Medicine | Admitting: Internal Medicine

## 2022-12-23 DIAGNOSIS — N6321 Unspecified lump in the left breast, upper outer quadrant: Secondary | ICD-10-CM

## 2023-10-13 ENCOUNTER — Ambulatory Visit (HOSPITAL_COMMUNITY)
Admission: RE | Admit: 2023-10-13 | Discharge: 2023-10-13 | Disposition: A | Discharge status: Home / Self Care | Source: Ambulatory Visit | Attending: Family Medicine | Admitting: Family Medicine

## 2023-10-13 ENCOUNTER — Encounter (HOSPITAL_COMMUNITY): Payer: Self-pay

## 2023-10-13 VITALS — BP 123/86 | HR 73 | Temp 98.4°F | Resp 18

## 2023-10-13 DIAGNOSIS — J029 Acute pharyngitis, unspecified: Secondary | ICD-10-CM | POA: Diagnosis present

## 2023-10-13 DIAGNOSIS — R590 Localized enlarged lymph nodes: Secondary | ICD-10-CM | POA: Insufficient documentation

## 2023-10-13 HISTORY — DX: Type 2 diabetes mellitus without complications: E11.9

## 2023-10-13 LAB — POCT RAPID STREP A (OFFICE): Rapid Strep A Screen: NEGATIVE

## 2023-10-13 MED ORDER — CEFUROXIME AXETIL 250 MG PO TABS: 250.0000 mg | ORAL_TABLET | Freq: Two times a day (BID) | ORAL | 0 refills | Status: DC

## 2023-10-13 MED ORDER — PREDNISONE 20 MG PO TABS: 40.0000 mg | ORAL_TABLET | Freq: Every day | ORAL | 0 refills | Status: AC

## 2023-10-13 NOTE — ED Triage Notes (Signed)
 Pt c/o persistent sore throat, rt neck soreness, and headache since Monday. Took tylenol with relief. Denies fever.

## 2023-10-13 NOTE — ED Provider Notes (Signed)
 [facility]  Note:  This document was prepared using Dragon voice recognition software and may include unintentional dictation errors.  MRN: [id number] DOB: Sep 03, 1960  Subjective:   Kairyn Olmeda is a 63 y.o. male presenting for persistent sore throat with bilateral anterior lymphadenopathy, headache, no fever since Monday.  Patient reports he has taken Tylenol with minimal to no relief.  Patient denies any cough, nasal congestion, body aches, fatigue.  Patient denies any known sick contacts.  No current facility-administered medications for this encounter.  Current Outpatient Medications:    cefUROXime  (CEFTIN ) 250 MG tablet, Take 1 tablet (250 mg total) by mouth 2 (two) times daily with a meal for 10 days., Disp: 20 tablet, Rfl: 0   JARDIANCE 25 MG TABS tablet, 1 tablet in the morning Orally Once a day for 90 days, Disp: , Rfl:    predniSONE  (DELTASONE ) 20 MG tablet, Take 2 tablets (40 mg total) by mouth daily for 5 days., Disp: 10 tablet, Rfl: 0   cetirizine (ZYRTEC) 10 MG tablet, Take 10 mg by mouth daily., Disp: , Rfl:    clobetasol cream (TEMOVATE) 0.05 %, SMARTSIG:Sparingly Topical Twice Daily, Disp: , Rfl:    famotidine (PEPCID) 20 MG tablet, Take 1 tablet by mouth daily., Disp: , Rfl:    ibuprofen (ADVIL,MOTRIN) 200 MG tablet, Take 200 mg by mouth every 6 (six) hours as needed., Disp: , Rfl:    naproxen  (NAPROSYN ) 375 MG tablet, Take 1 tablet (375 mg total) by mouth 2 (two) times daily with a meal., Disp: 30 tablet, Rfl: 0   tiZANidine  (ZANAFLEX ) 4 MG tablet, Take 1 tablet (4 mg total) by mouth at bedtime., Disp: 30 tablet, Rfl: 0   No Known Allergies  Past Medical History:  Diagnosis Date   Allergy    Diabetes mellitus without complication (HCC)    Eczema    GERD (gastroesophageal reflux disease)    History of prediabetes      Past Surgical History:  Procedure Laterality Date   TOOTH EXTRACTION      Family History  Problem Relation Age of Onset    Hypertension Mother    Heart disease Father    Diabetes Father    Hypertension Sister    Hypertension Brother     Social History   Tobacco Use   Smoking status: Never   Smokeless tobacco: Never  Substance Use Topics   Alcohol use: No   Drug use: No    ROS Refer to HPI for ROS details.  Objective:   Vitals: BP 123/86 (BP Location: Left Arm)   Pulse 73   Temp 98.4 F (36.9 C) (Oral)   Resp 18   SpO2 97%   Physical Exam Vitals and nursing note reviewed.  Constitutional:      General: He is not in acute distress.    Appearance: Normal appearance. He is well-developed. He is not ill-appearing or toxic-appearing.  HENT:     Head: Normocephalic.     Nose: Nose normal. No congestion or rhinorrhea.     Mouth/Throat:     Mouth: Mucous membranes are moist.     Pharynx: Oropharynx is clear. Posterior oropharyngeal erythema present. No oropharyngeal exudate.  Eyes:     General:        Right eye: No discharge.        Left eye: No discharge.     Extraocular Movements: Extraocular movements intact.     Conjunctiva/sclera: Conjunctivae normal.  Cardiovascular:     Rate and Rhythm:  Normal rate.  Pulmonary:     Effort: Pulmonary effort is normal. No respiratory distress.  Musculoskeletal:     Cervical back: Tenderness present. No rigidity.  Lymphadenopathy:     Cervical: Cervical adenopathy present.  Skin:    General: Skin is warm and dry.  Neurological:     General: No focal deficit present.     Mental Status: He is alert and oriented to person, place, and time.  Psychiatric:        Mood and Affect: Mood normal.        Behavior: Behavior normal.     Procedures  Results for orders placed or performed during the hospital encounter of 10/13/23 (from the past 24 hours)  POC rapid strep A     Status: None   Collection Time: 10/13/23 12:51 PM  Result Value Ref Range   Rapid Strep A Screen Negative Negative    No results found.   Assessment and Plan :      Discharge Instructions       1. Anterior cervical lymphadenopathy (Primary) 2. Acute pharyngitis, unspecified etiology - POC rapid strep A performed in UC is negative for strep pharyngitis - Culture, group A strep (throat) collected in UC and sent to lab for further testing results should be available in 2 to 3 days. - predniSONE  (DELTASONE ) 20 MG tablet; Take 2 tablets (40 mg total) by mouth daily for 5 days.  Dispense: 10 tablet; Refill: 0 - cefUROXime  (CEFTIN ) 250 MG tablet; Take 1 tablet (250 mg total) by mouth 2 (two) times daily with a meal for 10 days.  Dispense: 20 tablet; Refill: 0 -Continue to monitor symptoms for any change in severity if there is any escalation of current symptoms or development of new symptoms follow-up in ER for further evaluation and management.      Murat Rideout B Paras Kreider   Janiyla Long, [place], [place] 10/13/23 1358

## 2023-10-13 NOTE — Discharge Instructions (Addendum)
  1. Anterior cervical lymphadenopathy (Primary) 2. Acute pharyngitis, unspecified etiology - POC rapid strep A performed in UC is negative for strep pharyngitis - Culture, group A strep (throat) collected in UC and sent to lab for further testing results should be available in 2 to 3 days. - predniSONE  (DELTASONE ) 20 MG tablet; Take 2 tablets (40 mg total) by mouth daily for 5 days.  Dispense: 10 tablet; Refill: 0 - cefUROXime  (CEFTIN ) 250 MG tablet; Take 1 tablet (250 mg total) by mouth 2 (two) times daily with a meal for 10 days.  Dispense: 20 tablet; Refill: 0 -Continue to monitor symptoms for any change in severity if there is any escalation of current symptoms or development of new symptoms follow-up in ER for further evaluation and management.

## 2023-10-16 LAB — CULTURE, GROUP A STREP (THRC)

## 2023-10-22 ENCOUNTER — Encounter: Payer: Self-pay | Admitting: Internal Medicine

## 2023-10-22 ENCOUNTER — Ambulatory Visit (HOSPITAL_COMMUNITY)
Admission: RE | Admit: 2023-10-22 | Discharge: 2023-10-22 | Disposition: A | Discharge status: Home / Self Care | Source: Ambulatory Visit

## 2023-10-22 ENCOUNTER — Encounter (HOSPITAL_COMMUNITY): Payer: Self-pay

## 2023-10-22 VITALS — BP 123/82 | HR 59 | Temp 98.4°F | Resp 16

## 2023-10-22 DIAGNOSIS — S61213A Laceration without foreign body of left middle finger without damage to nail, initial encounter: Secondary | ICD-10-CM

## 2023-10-22 MED ORDER — MUPIROCIN 2 % EX OINT: 1.0000 | TOPICAL_OINTMENT | Freq: Two times a day (BID) | CUTANEOUS | 0 refills | Status: AC

## 2023-10-22 MED ORDER — LIDOCAINE HCL (PF) 2 % IJ SOLN
INTRAMUSCULAR | Status: AC
  Filled 2023-10-22: qty 5

## 2023-10-22 NOTE — ED Provider Notes (Signed)
 [facility]    CSN: [id number] Arrival date & time: 10/22/23  1057      History   Chief Complaint Chief Complaint  Patient presents with   Laceration    Left middle finger    HPI Jack Contreras is a 63 y.o. male.   Patient presents with laceration to his left middle finger that occurred around 8:30 PM last night.  Patient states that he excellently cut it on some scissors and that the bleeding continues.  Patient does have a bandage at this time and the bleeding is controlled underneath the bandage.  Patient reports last Tdap was 11/15/2020.  Patient denies taking any blood thinners.  The history is provided by the patient and medical records.  Laceration   Past Medical History:  Diagnosis Date   Allergy    Diabetes mellitus without complication (HCC)    Eczema    GERD (gastroesophageal reflux disease)    History of prediabetes     There are no active problems to display for this patient.   Past Surgical History:  Procedure Laterality Date   TOOTH EXTRACTION         Home Medications    Prior to Admission medications   Medication Sig Start Date End Date Taking? Authorizing Provider  mupirocin  ointment (BACTROBAN ) 2 % Apply 1 Application topically 2 (two) times daily. 10/22/23  Yes Johnie, Manpreet Kemmer A, NP  cetirizine (ZYRTEC) 10 MG tablet Take 10 mg by mouth daily.    [provider]  clobetasol cream (TEMOVATE) 0.05 % SMARTSIG:Sparingly Topical Twice Daily    [provider]  famotidine (PEPCID) 20 MG tablet Take 1 tablet by mouth daily. 05/03/18   [provider]  fluticasone (FLONASE) 50 MCG/ACT nasal spray Place 1 spray into both nostrils daily.    [provider]  JARDIANCE 25 MG TABS tablet 1 tablet in the morning Orally Once a day for 90 days 07/18/23   [provider]    Family History Family History  Problem Relation Age of Onset   Hypertension Mother    Heart disease Father    Diabetes Father     Hypertension Sister    Hypertension Brother     Social History Social History   Tobacco Use   Smoking status: Never   Smokeless tobacco: Never  Substance Use Topics   Alcohol use: No   Drug use: No     Allergies   Patient has no known allergies.   Review of Systems Review of Systems  Per HPI  Physical Exam Triage Vital Signs ED Triage Vitals  Encounter Vitals Group     BP 10/22/23 1119 123/82     Girls Systolic BP Percentile --      Girls Diastolic BP Percentile --      Boys Systolic BP Percentile --      Boys Diastolic BP Percentile --      Pulse Rate 10/22/23 1119 (!) 59     Resp 10/22/23 1119 16     Temp 10/22/23 1119 98.4 F (36.9 C)     Temp Source 10/22/23 1119 Oral     SpO2 10/22/23 1119 97 %     Weight --      Height --      Head Circumference --      Peak Flow --      Pain Score 10/22/23 1120 3     Pain Loc --      Pain Education --  Exclude from Growth Chart --    No data found.  Updated Vital Signs BP 123/82 (BP Location: Right Arm)   Pulse (!) 59   Temp 98.4 F (36.9 C) (Oral)   Resp 16   SpO2 97%   Visual Acuity Right Eye Distance:   Left Eye Distance:   Bilateral Distance:    Right Eye Near:   Left Eye Near:    Bilateral Near:     Physical Exam Vitals and nursing note reviewed.  Constitutional:      General: He is awake. He is not in acute distress.    Appearance: Normal appearance. He is well-developed and well-groomed. He is not ill-appearing.  Musculoskeletal:     Left hand: Laceration present. Normal range of motion. Normal strength. Normal sensation. Normal capillary refill. Normal pulse.  Skin:    General: Skin is warm and dry.     Findings: Laceration present.     Comments: Approximately 3.5 cm curved laceration noted to the anterior lateral aspect of the left middle finger.  Mild bleeding noted upon bandage removal.  Neurological:     Mental Status: He is alert.  Psychiatric:        Behavior: Behavior is  cooperative.      UC Treatments / Results  Labs (all labs ordered are listed, but only abnormal results are displayed) Labs Reviewed - No data to display  EKG   Radiology No results found.  Procedures Laceration Repair  Date/Time: 10/22/2023 12:49 PM  Performed by: Johnie Rumaldo LABOR, NP Authorized by: Johnie Rumaldo LABOR, NP   Consent:    Consent obtained:  Verbal   Consent given by:  Patient   Risks, benefits, and alternatives were discussed: yes     Risks discussed:  Infection, need for additional repair, pain, poor cosmetic result and poor wound healing   Alternatives discussed:  No treatment and delayed treatment Universal protocol:    Procedure explained and questions answered to patient or proxy's satisfaction: yes     Relevant documents present and verified: yes     Patient identity confirmed:  Verbally with patient Anesthesia:    Anesthesia method:  Local infiltration   Local anesthetic:  Lidocaine  2% w/o epi Laceration details:    Location:  Finger   Finger location:  L long finger   Length (cm):  3.5 Pre-procedure details:    Preparation:  Patient was prepped and draped in usual sterile fashion Exploration:    Imaging outcome: foreign body not noted     Wound exploration: wound explored through full range of motion and entire depth of wound visualized     Contaminated: no   Treatment:    Area cleansed with:  Povidone-iodine, Shur-Clens and soap and water   Amount of cleaning:  Standard   Debridement:  None   Undermining:  None Skin repair:    Repair method:  Sutures   Suture size:  5-0   Suture material:  Prolene   Suture technique:  Simple interrupted   Number of sutures:  5 Approximation:    Approximation:  Close Repair type:    Repair type:  Simple Post-procedure details:    Dressing:  Bulky dressing   Procedure completion:  Tolerated well, no immediate complications  (including critical care time)  Medications Ordered in UC Medications -  No data to display  Initial Impression / Assessment and Plan / UC Course  I have reviewed the triage vital signs and the nursing notes.  Pertinent labs &  imaging results that were available during my care of the patient were reviewed by me and considered in my medical decision making (see chart for details).     Patient is overall well-appearing.  Vitals are stable.  Laceration repaired as documented above.  Discussed proper wound care at home.  Prescribed Pearson for infection prevention.  Discussed follow-up and return precautions. Final Clinical Impressions(s) / UC Diagnoses   Final diagnoses:  Laceration of left middle finger without foreign body without damage to nail, initial encounter     Discharge Instructions      Do not the laceration wet in the first 24 hours. After that you can clean with mild soap and water and apply mupirocin  ointment twice daily for infection coverage. Keep the area clean dry and covered. Return here in 10 to 14 days for suture removal. Return here sooner if you notice swelling, redness, pain, or drainage from the area.    ED Prescriptions     Medication Sig Dispense Auth. Provider   mupirocin  ointment (BACTROBAN ) 2 % Apply 1 Application topically 2 (two) times daily. 22 g Johnie Flaming A, NP      PDMP not reviewed this encounter.   Johnie Flaming A, NP 10/22/23 1251

## 2023-10-22 NOTE — Discharge Instructions (Signed)
 Do not the laceration wet in the first 24 hours. After that you can clean with mild soap and water and apply mupirocin  ointment twice daily for infection coverage. Keep the area clean dry and covered. Return here in 10 to 14 days for suture removal. Return here sooner if you notice swelling, redness, pain, or drainage from the area.

## 2023-10-22 NOTE — ED Triage Notes (Signed)
 Patient here today with c/o a laceration to left middle finger last night with scissors. Patient states that it wont stop bleeding. Last Tdap 11/15/2020.

## 2023-11-01 ENCOUNTER — Ambulatory Visit (HOSPITAL_COMMUNITY)
Admission: RE | Admit: 2023-11-01 | Discharge: 2023-11-01 | Disposition: A | Discharge status: Home / Self Care | Payer: Self-pay | Source: Ambulatory Visit | Attending: Emergency Medicine | Admitting: Emergency Medicine

## 2023-11-01 DIAGNOSIS — Z4802 Encounter for removal of sutures: Secondary | ICD-10-CM | POA: Diagnosis not present

## 2023-11-01 NOTE — ED Triage Notes (Signed)
 Pt here for suture removal placed on 10/22/2023,

## 2024-02-29 ENCOUNTER — Telehealth: Payer: Self-pay

## 2024-02-29 NOTE — Telephone Encounter (Signed)
 Telephone call placed to patient to schedule a PV and Colonoscopy w/ Dr. Avram per recall assessment sheet.  VM obtained and message left for patient to call back and ask to speak with someone in scheduling.
